# Patient Record
Sex: Female | Born: 2005 | Race: White | Hispanic: No | Marital: Single | Smoking: Never smoker
Health system: Southern US, Community
[De-identification: ages and names within clinical notes are randomized; demographics above are authoritative.]

## PROBLEM LIST (undated history)

## (undated) HISTORY — PX: TONSILLECTOMY: SUR1361

---
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
## 2023-10-19 ENCOUNTER — Ambulatory Visit
Admission: EM | Admit: 2023-10-19 | Discharge: 2023-10-19 | Disposition: A | Discharge status: Home / Self Care | Payer: BC Managed Care – PPO

## 2023-10-19 NOTE — ED Notes (Signed)
Pt left as we did not have x rays on site and she did not want to go to outpatient xrays.

## 2023-10-21 ENCOUNTER — Ambulatory Visit: Payer: Self-pay | Admitting: Physician Assistant

## 2023-10-21 NOTE — Telephone Encounter (Signed)
   Chief Complaint: numbness in toes on left foot Symptoms: toes cold and numb Frequency: constant  Disposition: [] ED /[] Urgent Care (no appt availability in office) / [x] Appointment(In office/virtual)/ []  [place] [facility]/ [] Home Care/ [] Refused Recommended Disposition /[] [place] [facility]/ []  Follow-up with PCP Additional Notes: Pt complaining since Friday that left foot toes are numb  and cold. Pt was at work and doesn't remember any sort of accident to cause this. Pt went to urgent care Sunday for xray- Pt was told no visible broken bones. Pt still has numbness and cold toes and wants to make appt with PCP. PT transferred to [facility] for appt to be made. RN gave care advice and pt verbalized understanding.         Copied from CRM [id number]. Topic: Clinical - Red Word Triage >> Oct 21, 2023  9:01 AM Fuller Mandril wrote: Red Word that prompted transfer to Nurse Triage: Numbness tingling left foot. Circulation concerns. Seen at urgent care with x-rays. Reason for Disposition  [1] Weakness of the face, arm / hand, or leg / foot on one side of the body AND [2] gradual onset (e.g., days to weeks) AND [3] present now  Answer Assessment - Initial Assessment Questions 1. SYMPTOM: "What is the main symptom you are concerned about?" (e.g., weakness, numbness)     Numbness in left foot toes  2. ONSET: "When did this start?" (minutes, hours, days; while sleeping)     friday 3. LAST NORMAL: "When was the last time you (the patient) were normal (no symptoms)?"     Thursday  4. PATTERN "Does this come and go, or has it been constant since it started?"  "Is it present now?"     Left foot Numbness comes and goes  5. CARDIAC SYMPTOMS: "Have you had any of the following symptoms: chest pain, difficulty breathing, palpitations?"     Denies  6. NEUROLOGIC SYMPTOMS: "Have you had any of the following symptoms: headache, dizziness, vision loss, double vision, changes in speech, unsteady on your feet?"      Denies all  7. OTHER SYMPTOMS: "Do you have any other symptoms?"     Denies  8. PREGNANCY: "Is there any chance you are pregnant?" "When was your last menstrual period?"     Possibility  Protocols used: Neurologic Deficit-A-AH

## 2023-10-23 ENCOUNTER — Ambulatory Visit: Payer: Managed Care, Other (non HMO) | Admitting: Sports Medicine

## 2023-10-23 DIAGNOSIS — J309 Allergic rhinitis, unspecified: Secondary | ICD-10-CM | POA: Insufficient documentation

## 2023-10-23 DIAGNOSIS — Z23 Encounter for immunization: Secondary | ICD-10-CM | POA: Insufficient documentation

## 2023-10-23 DIAGNOSIS — H6120 Impacted cerumen, unspecified ear: Secondary | ICD-10-CM | POA: Insufficient documentation

## 2023-10-23 DIAGNOSIS — R059 Cough, unspecified: Secondary | ICD-10-CM | POA: Insufficient documentation

## 2023-10-23 DIAGNOSIS — Z049 Encounter for examination and observation for unspecified reason: Secondary | ICD-10-CM | POA: Insufficient documentation

## 2023-10-23 DIAGNOSIS — J40 Bronchitis, not specified as acute or chronic: Secondary | ICD-10-CM | POA: Insufficient documentation

## 2023-10-23 DIAGNOSIS — L853 Xerosis cutis: Secondary | ICD-10-CM | POA: Insufficient documentation

## 2023-10-23 DIAGNOSIS — Z111 Encounter for screening for respiratory tuberculosis: Secondary | ICD-10-CM | POA: Insufficient documentation

## 2023-10-23 DIAGNOSIS — J310 Chronic rhinitis: Secondary | ICD-10-CM | POA: Insufficient documentation

## 2023-10-23 DIAGNOSIS — M25572 Pain in left ankle and joints of left foot: Secondary | ICD-10-CM | POA: Insufficient documentation

## 2023-10-23 DIAGNOSIS — H6640 Suppurative otitis media, unspecified, unspecified ear: Secondary | ICD-10-CM | POA: Insufficient documentation

## 2023-10-23 DIAGNOSIS — N739 Female pelvic inflammatory disease, unspecified: Secondary | ICD-10-CM | POA: Insufficient documentation

## 2023-10-23 DIAGNOSIS — M6740 Ganglion, unspecified site: Secondary | ICD-10-CM | POA: Insufficient documentation

## 2023-10-23 DIAGNOSIS — H9209 Otalgia, unspecified ear: Secondary | ICD-10-CM | POA: Insufficient documentation

## 2023-10-23 MED ORDER — CELECOXIB 200 MG PO CAPS: ORAL_CAPSULE | ORAL | 2 refills | Status: DC

## 2023-10-23 NOTE — Assessment & Plan Note (Signed)
 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 About a week ago she stood up and felt a pop left ankle lateral aspect. She had immediate pain, swelling, and inability to bear weight. Ultimately she was seen in [facility] urgent care, x-rays were done that were self-reported as negative though I do not have images. She was placed in a boot and referred to me. She is doing a lot better, swelling has resolved, paresthesias have gotten a lot better. She still has some tenderness that she localizes at the ATFL, lesser so over the peroneals, she also has some tenderness lateral talar dome. Otherwise good motion, good strength, sensation is grossly intact distally and she has good dorsalis pedis and posterior tibial pulses as well as normal capillary refill in the toes. Suspect ankle sprain, I think the abnormal sensation was likely related to her swelling. Continue boot for another 2 weeks, she will get the x-rays on a disk for me, adding some home physical therapy, she will do some Celebrex  with food. If insufficient improvement after 2 or 3 weeks we will consider MRI.

## 2023-10-23 NOTE — Progress Notes (Signed)
    Procedures performed today:    None.  Independent interpretation of notes and tests performed by another provider:   None.  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 About a week ago she stood up and felt a pop left ankle lateral aspect. She had immediate pain, swelling, and inability to bear weight. Ultimately she was seen in [facility] urgent care, x-rays were done that were self-reported as negative though I do not have images. She was placed in a boot and referred to me. She is doing a lot better, swelling has resolved, paresthesias have gotten a lot better. She still has some tenderness that she localizes at the ATFL, lesser so over the peroneals, she also has some tenderness lateral talar dome. Otherwise good motion, good strength, sensation is grossly intact distally and she has good dorsalis pedis and posterior tibial pulses as well as normal capillary refill in the toes. Suspect ankle sprain, I think the abnormal sensation was likely related to her swelling. Continue boot for another 2 weeks, she will get the x-rays on a disk for me, adding some home physical therapy, she will do some Celebrex  with food. If insufficient improvement after 2 or 3 weeks we will consider MRI.    ____________________________________________ Debby PARAS. Curtis, M.D., ABFM., CAQSM., AME. Primary Care and Sports Medicine [facility]  Adjunct Professor of [facility] Medicine  [employer]  Restaurant Manager, [employer]

## 2023-10-23 NOTE — Addendum Note (Signed)
 Addended by: Arvel Lather A on: 10/23/2023 09:29 AM   Modules accepted: Orders

## 2023-10-24 NOTE — Telephone Encounter (Signed)
 Patient seen in office 10/23/23 by Dr. Sandy Crumb.

## 2023-11-09 IMAGING — CT CT ABD-PELV W/ CM
2 of 4 series · 16 of 46 positions shown, 18 images · IV contrast (agent unspecified)
Comparison: None Available.

CLINICAL DATA: Acute epigastric abdominal pain.

EXAM:
CT ABDOMEN AND PELVIS WITH CONTRAST
TECHNIQUE: Multidetector CT imaging of the abdomen and pelvis was performed
using the standard protocol following bolus administration of
intravenous contrast.

[Series 3: a/p w/ 5mm · axial · 0.80mm/px · z∈[+842,+1262]mm · 13 of 92 slices shown, 15 images]
[im 4/92  soft-tissue]
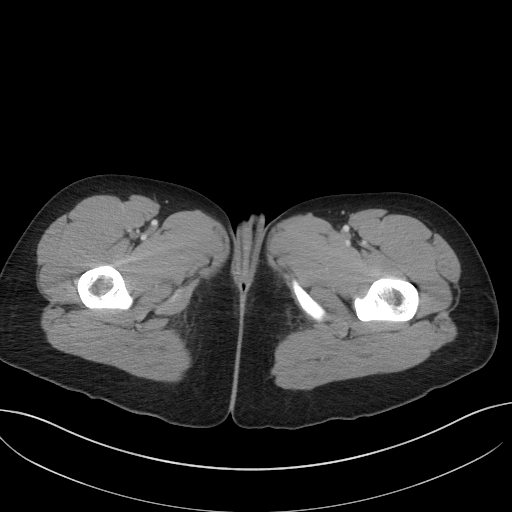
[im 4/92  bone]
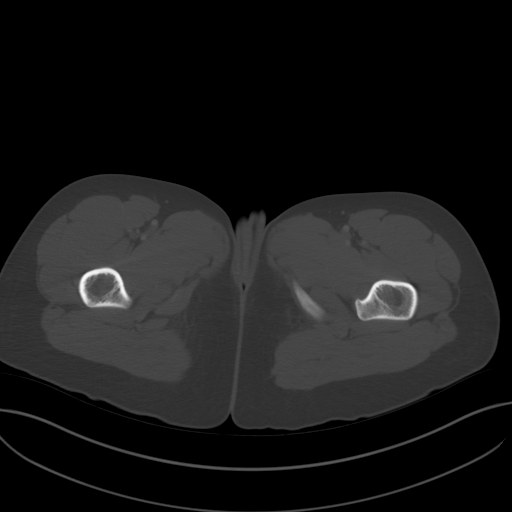
[im 11/92  soft-tissue]
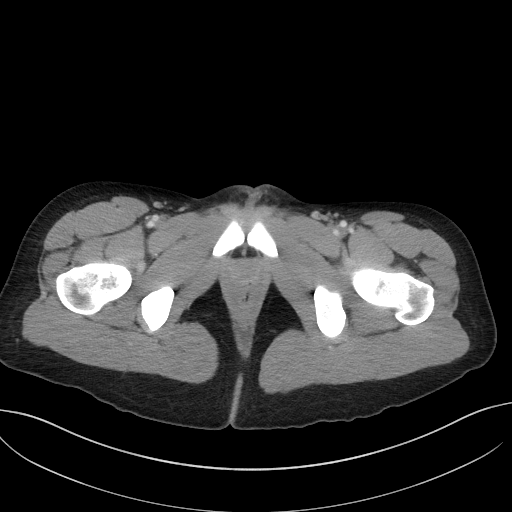
[im 18/92  soft-tissue]
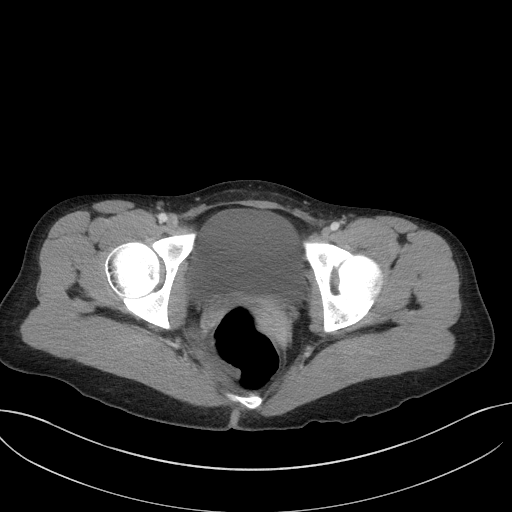
[im 25/92  soft-tissue]
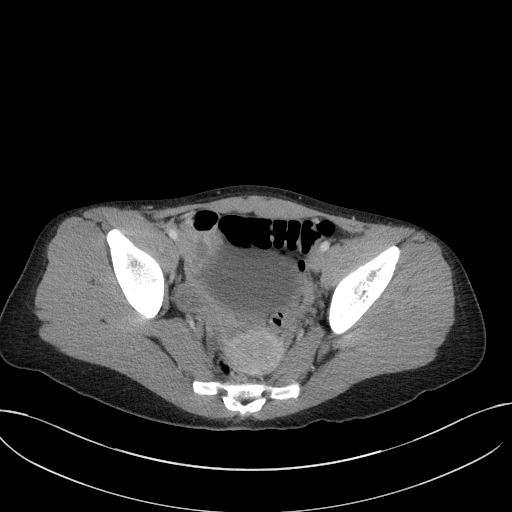
[im 32/92  soft-tissue]
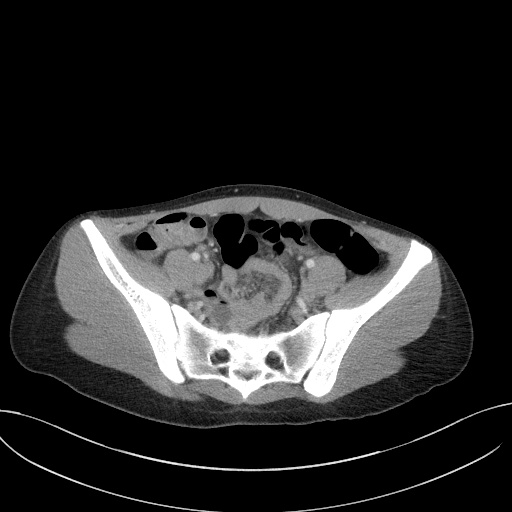
[im 39/92  soft-tissue]
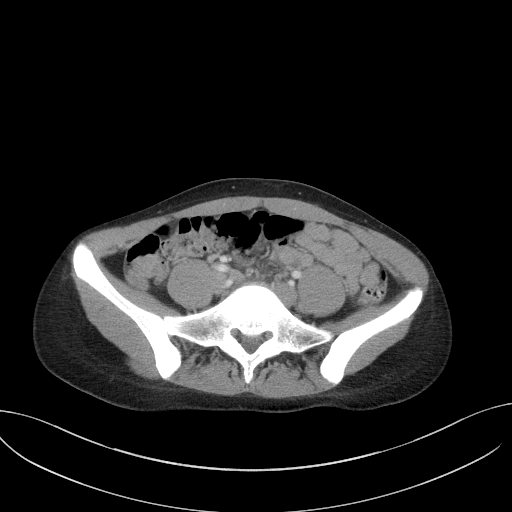
[im 46/92  soft-tissue]
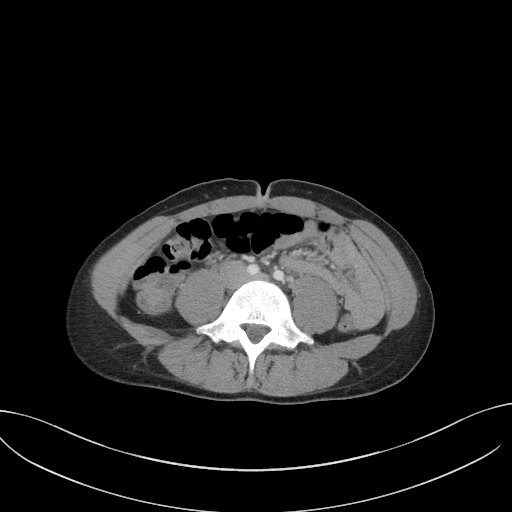
[im 53/92  soft-tissue]
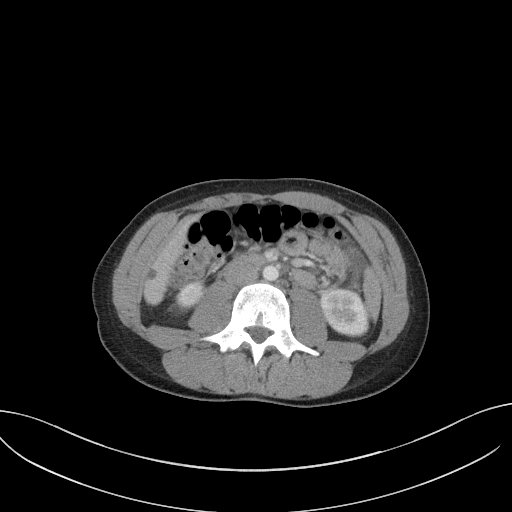
[im 60/92  soft-tissue]
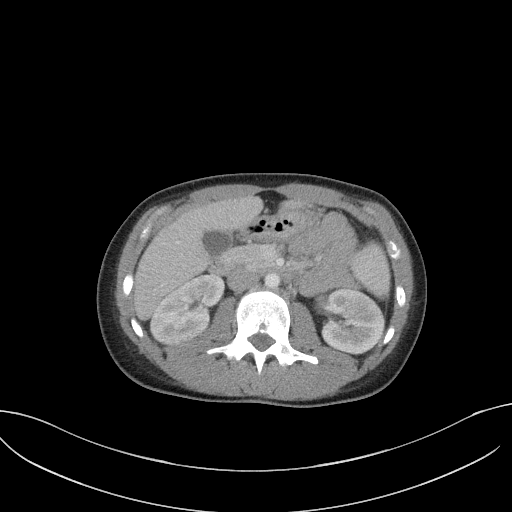
[im 60/92  bone]
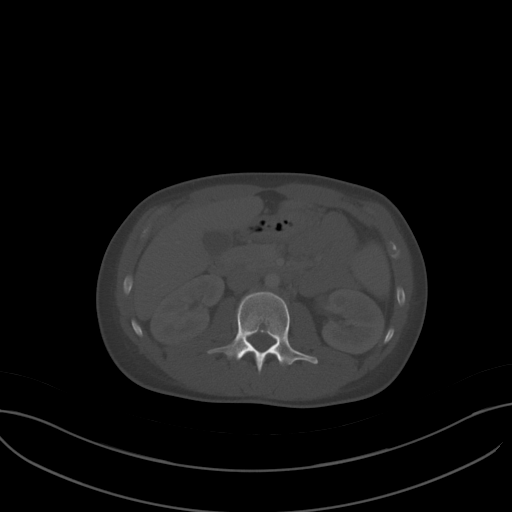
[im 67/92  soft-tissue]
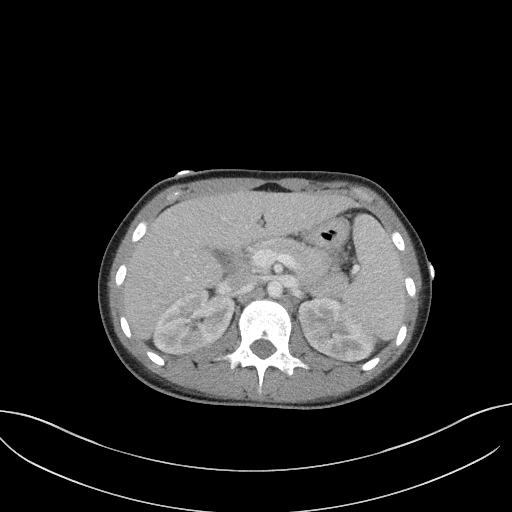
[im 74/92  soft-tissue]
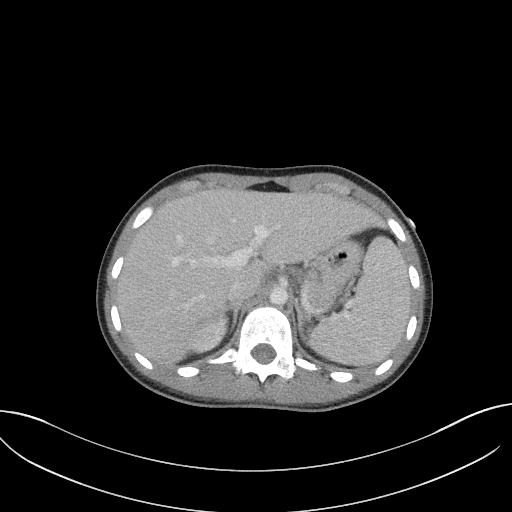
[im 81/92  soft-tissue]
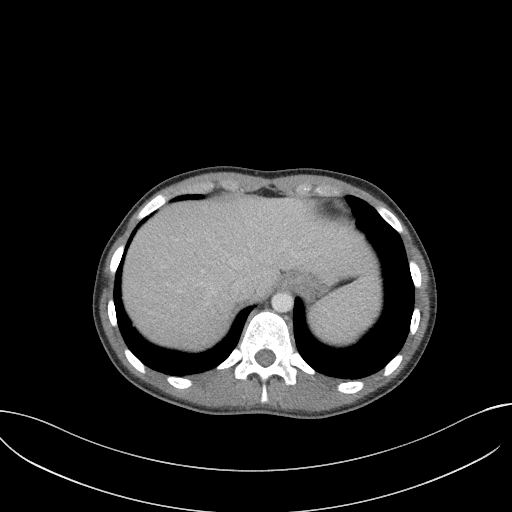
[im 88/92  soft-tissue]
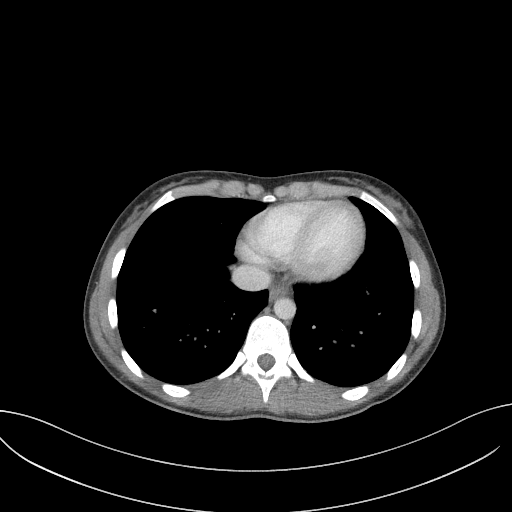

[Series 6: a/p w/ cor · coronal · 0.75mm/px · 3 of 115 slices shown]
[im 39/115  soft-tissue]
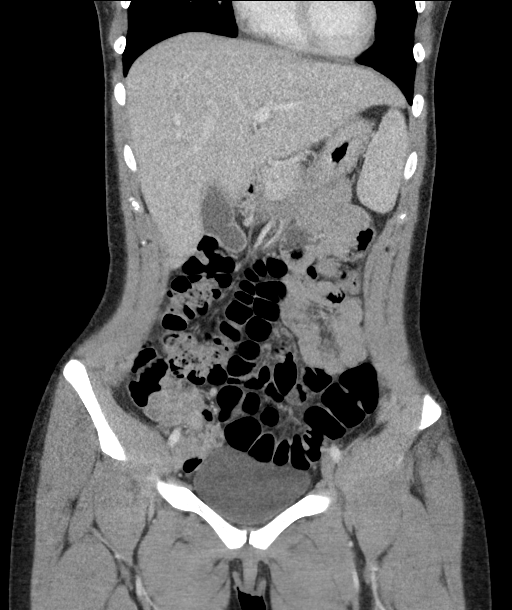
[im 51/115  soft-tissue]
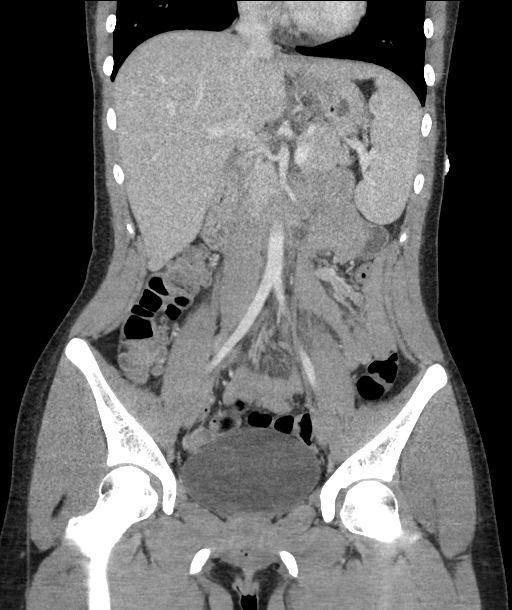
[im 64/115  soft-tissue]
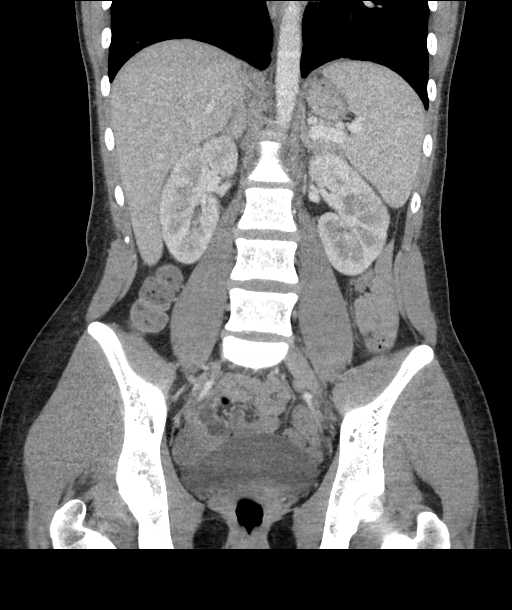

[16 of 46 positions shown; findings below may reference images not displayed]

RADIATION DOSE REDUCTION: This exam was performed according to the
departmental dose-optimization program which includes automated
exposure control, adjustment of the mA and/or kV according to
patient size and/or use of iterative reconstruction technique.

CONTRAST:  100mL OMNIPAQUE IOHEXOL 300 MG/ML  SOLN
FINDINGS: Lower chest: No acute abnormality.

Hepatobiliary: No focal liver abnormality is seen. No gallstones,
gallbladder wall thickening, or biliary dilatation.

Pancreas: Unremarkable. No pancreatic ductal dilatation or
surrounding inflammatory changes.

Spleen: Normal in size without focal abnormality.

Adrenals/Urinary Tract: Adrenal glands are unremarkable. Kidneys are
normal, without renal calculi, focal lesion, or hydronephrosis.
Bladder is unremarkable.

Stomach/Bowel: Stomach is within normal limits. Appendix appears
normal. No evidence of bowel wall thickening, distention, or
inflammatory changes.

Vascular/Lymphatic: No significant vascular findings are present. No
enlarged abdominal or pelvic lymph nodes.

Reproductive: Uterus and bilateral adnexa are unremarkable.

Other: No abdominal wall hernia or abnormality. No abdominopelvic
ascites.

Musculoskeletal: No acute or significant osseous findings.
IMPRESSION: No definite abnormality seen in the abdomen or pelvis.

## 2023-11-11 ENCOUNTER — Telehealth: Payer: Self-pay | Admitting: Sports Medicine

## 2023-11-11 NOTE — Telephone Encounter (Signed)
 Copied from CRM [id number]. Topic: Medical Record Request - Records Request >> Nov 08, 2023  2:12 PM Eunice Blase wrote: Reason for CRM: Received call from [facility]: Lambert Rhonda W per Parker ph: [phone], fax: [phone] sent request for treatment with Dr. Lorenda Peck was sent 10/25/2023. Please respond to request.

## 2023-11-13 ENCOUNTER — Encounter: Payer: Self-pay | Admitting: Sports Medicine

## 2023-11-13 ENCOUNTER — Ambulatory Visit (INDEPENDENT_AMBULATORY_CARE_PROVIDER_SITE_OTHER): Payer: BC Managed Care – PPO | Admitting: Sports Medicine

## 2023-11-13 DIAGNOSIS — G8929 Other chronic pain: Secondary | ICD-10-CM | POA: Diagnosis not present

## 2023-11-13 DIAGNOSIS — M25572 Pain in left ankle and joints of left foot: Secondary | ICD-10-CM

## 2023-11-13 NOTE — Assessment & Plan Note (Signed)
 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 Also thinking about ultrasound school. Approximately 4 to 5 weeks ago she stood up and felt a pop left lateral ankle, she had immediate pain, swelling, and inability to bear weight, [facility] that were [facility]. She was placed in a boot. Swelling had resolved by her follow-up and paresthesias had improved considerably, she did have some tenderness at the ATFL and less so at the peroneals, also tenderness lateral talar dome. She has now been in the boot for about 3 weeks, unfortunately she is still complaining of pain mostly over the dorsal midfoot. No tenderness over the ATFL, good strength, however she does have some pain with terminal dorsiflexion. She also has tenderness over the third and fourth metatarsal shafts and TMT. Due to persistence of discomfort with concern for occult fracture we will proceed with an MRI of the foot and ankle. Worker's Comp. is covering all of her bills. She will continue Tylenol as needed and continue the boot for now. Return to see me to go over MRI results.

## 2023-11-13 NOTE — Progress Notes (Signed)
    Procedures performed today:    None.  Independent interpretation of notes and tests performed by another provider:   None.  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 Approximately 4 to 5 weeks ago she stood up and felt a pop left lateral ankle, she had immediate pain, swelling, and inability to bear weight, x-rays were done at a [facility] that were negative. She was placed in a boot. Swelling had resolved by her follow-up and paresthesias had improved considerably, she did have some tenderness at the ATFL and less so at the peroneals, also tenderness lateral talar dome. She has now been in the boot for about 3 weeks, unfortunately she is still complaining of pain mostly over the dorsal midfoot. No tenderness over the ATFL, good strength, however she does have some pain with terminal dorsiflexion. She also has tenderness over the third and fourth metatarsal shafts and TMT. Due to persistence of discomfort with concern for occult fracture we will proceed with an MRI of the foot and ankle. Worker's Comp. is covering all of her bills. She will continue Tylenol as needed and continue the boot for now. Return to see me to go over MRI results.    ____________________________________________ Ihor Austin. Benjamin Stain, M.D., ABFM., CAQSM., AME. Primary Care and Sports Medicine [facility]  Adjunct Professor of Family Medicine  [employer]  Restaurant manager, fast food

## 2023-11-15 ENCOUNTER — Telehealth: Payer: Self-pay

## 2023-11-15 NOTE — Telephone Encounter (Signed)
 Looks like patient has been scheduled.

## 2023-11-15 NOTE — Telephone Encounter (Signed)
 Copied from CRM [id number]. Topic: Referral - Status >> Nov 14, 2023  2:20 PM Priscille Loveless wrote: Reason for CRM: Pt is calling to check on the status of the mri that she discussed with the dr yesterday. Please advise, thank you!

## 2023-11-16 ENCOUNTER — Ambulatory Visit (INDEPENDENT_AMBULATORY_CARE_PROVIDER_SITE_OTHER): Payer: Worker's Compensation

## 2023-11-16 DIAGNOSIS — G8929 Other chronic pain: Secondary | ICD-10-CM

## 2023-11-16 DIAGNOSIS — M25572 Pain in left ankle and joints of left foot: Secondary | ICD-10-CM | POA: Diagnosis not present

## 2023-12-10 NOTE — Telephone Encounter (Signed)
 Yes that is fine, can try to do it as an exchange with Rayfield Citizen from [place].  That way she does not have to pay for a new one.

## 2023-12-10 NOTE — Telephone Encounter (Signed)
 Please review the previous note. Can she come in for a new boot? Thanks in advance.

## 2023-12-10 NOTE — Telephone Encounter (Signed)
 Copied from CRM [id number]. Topic: Clinical - Lab/Test Results >> Dec 10, 2023  1:08 PM Yolanda T wrote: Reason for CRM: patient would like a call back to better explain her test results, patient broke her boot and would like to know if provider can write a script to replace her left foot boot. Please f/u with patient

## 2023-12-11 ENCOUNTER — Telehealth: Payer: Self-pay

## 2023-12-11 DIAGNOSIS — M25572 Pain in left ankle and joints of left foot: Secondary | ICD-10-CM

## 2023-12-11 MED ORDER — MELOXICAM 15 MG PO TABS: ORAL_TABLET | ORAL | 3 refills | Status: AC

## 2023-12-11 NOTE — Telephone Encounter (Unsigned)
 Copied from CRM [id number]. Topic: General - Other >> Dec 11, 2023  2:03 PM Gildardo Pounds wrote: Reason for CRM: Patient is returning a call from [place]. Callback number is [phone]

## 2023-12-11 NOTE — Telephone Encounter (Signed)
 Task completed. Patient was provided with a new boot today during a [place].

## 2023-12-11 NOTE — Telephone Encounter (Signed)
 Spoke with patient she had questioned whether could get a second opinion referral from our office for worker's comp In speaking with patient questioned if should  get referral for second opinion through workers comp ?  She was not sure who should request the 2nd opinion but she will speak with her Marketing executive and get back to [place] to let us know how she would like to proceed.

## 2023-12-11 NOTE — Telephone Encounter (Signed)
 Patient requesting a prescription for pain medication - due to reactive edema/stress injury medial malleolus of the ankle  States current pain level is about 7 .  States she is on her feet a lot at work ( will continue to use the boot as well)

## 2023-12-11 NOTE — Telephone Encounter (Signed)
 Spoke with patient informed about meloxicam prescription

## 2023-12-11 NOTE — Telephone Encounter (Signed)
Attempted call to patient . Mail box full could not leave a voice mail message.  

## 2023-12-11 NOTE — Telephone Encounter (Signed)
 Adding meloxicam, have her let me know over the next week if this is not enough.

## 2023-12-23 ENCOUNTER — Encounter: Payer: Self-pay | Admitting: Sports Medicine

## 2024-05-19 ENCOUNTER — Encounter: Payer: Self-pay | Admitting: Sports Medicine

## 2024-06-08 ENCOUNTER — Ambulatory Visit: Admission: EM | Admit: 2024-06-08 | Discharge: 2024-06-08 | Disposition: A | Discharge status: Home / Self Care

## 2024-06-08 DIAGNOSIS — J029 Acute pharyngitis, unspecified: Secondary | ICD-10-CM | POA: Insufficient documentation

## 2024-06-08 DIAGNOSIS — R0981 Nasal congestion: Secondary | ICD-10-CM | POA: Insufficient documentation

## 2024-06-08 DIAGNOSIS — B372 Candidiasis of skin and nail: Secondary | ICD-10-CM | POA: Insufficient documentation

## 2024-06-08 DIAGNOSIS — Z1152 Encounter for screening for COVID-19: Secondary | ICD-10-CM | POA: Diagnosis not present

## 2024-06-08 DIAGNOSIS — Z020 Encounter for examination for admission to educational institution: Secondary | ICD-10-CM | POA: Insufficient documentation

## 2024-06-08 DIAGNOSIS — B9789 Other viral agents as the cause of diseases classified elsewhere: Secondary | ICD-10-CM | POA: Insufficient documentation

## 2024-06-08 DIAGNOSIS — N949 Unspecified condition associated with female genital organs and menstrual cycle: Secondary | ICD-10-CM | POA: Insufficient documentation

## 2024-06-08 DIAGNOSIS — J069 Acute upper respiratory infection, unspecified: Secondary | ICD-10-CM | POA: Insufficient documentation

## 2024-06-08 DIAGNOSIS — Z029 Encounter for administrative examinations, unspecified: Secondary | ICD-10-CM | POA: Insufficient documentation

## 2024-06-08 DIAGNOSIS — Z23 Encounter for immunization: Secondary | ICD-10-CM | POA: Insufficient documentation

## 2024-06-08 DIAGNOSIS — H6642 Suppurative otitis media, unspecified, left ear: Secondary | ICD-10-CM | POA: Insufficient documentation

## 2024-06-08 DIAGNOSIS — J310 Chronic rhinitis: Secondary | ICD-10-CM | POA: Insufficient documentation

## 2024-06-08 DIAGNOSIS — H6690 Otitis media, unspecified, unspecified ear: Secondary | ICD-10-CM | POA: Insufficient documentation

## 2024-06-08 DIAGNOSIS — M67439 Ganglion, unspecified wrist: Secondary | ICD-10-CM | POA: Insufficient documentation

## 2024-06-08 LAB — POCT RAPID STREP A (OFFICE): Rapid Strep A Screen: NEGATIVE

## 2024-06-08 LAB — POC SOFIA SARS ANTIGEN FIA: SARS Coronavirus 2 Ag: NEGATIVE

## 2024-06-08 MED ORDER — PSEUDOEPH-BROMPHEN-DM 30-2-10 MG/5ML PO SYRP: 10.0000 mL | ORAL_SOLUTION | Freq: Four times a day (QID) | ORAL | 0 refills | Status: AC | PRN

## 2024-06-08 MED ORDER — FLUTICASONE PROPIONATE 50 MCG/ACT NA SUSP: 2.0000 | Freq: Every day | NASAL | 0 refills | Status: AC

## 2024-06-08 MED ORDER — CETIRIZINE-PSEUDOEPHEDRINE ER 5-120 MG PO TB12: 1.0000 | ORAL_TABLET | Freq: Every day | ORAL | 0 refills | Status: AC

## 2024-06-08 MED ORDER — PREDNISONE 20 MG PO TABS: 40.0000 mg | ORAL_TABLET | Freq: Every day | ORAL | 0 refills | Status: AC

## 2024-06-08 NOTE — Discharge Instructions (Addendum)
 Your strep and COVID tests were both negative. Your symptoms are most likely caused by a respiratory infection, which affects areas like your nose, throat, or lungs. This type of infection is usually caused by a virus. Since your illness is caused by a virus, antibiotics won't help because they only treat infections caused by bacteria.  Take the medications that were prescribed to you as directed. If you have a fever, headache, or body aches, you can also take Tylenol  or ibuprofen  to help you feel more comfortable. Be sure to drink plenty of fluids to stay hydrated--aim for enough to keep your urine a pale yellow color. This will also help to thin mucus and make it easier to clear from your body.   Using a cool mist humidifier at home to keep humidity levels above 50% can be helpful. You can also inhale steam for 10 to 15 minutes, 3 to 4 times a day. This can be done by sitting in the bathroom with a hot shower running, or by using over-the-counter vapor shower tablets to help with nasal congestion. Try to avoid cool or dry air as much as possible. When you sleep, keep your head elevated to help reduce post-nasal drainage. Be sure to get enough rest every night to support your recovery.Don't forget to replace your toothbrush once you start feeling better.   It's normal for a cough to linger for several weeks after a respiratory illness, even after other symptoms have resolved. This happens because the airways remain irritated and take time to fully heal. As long as the cough gradually improves and there are no new concerning symptoms, this is part of the normal recovery process.  If your symptoms get worse or if you develop any new or concerning symptoms, go to the emergency room right away. If you're not feeling better in a few days, follow up with your primary care provider.

## 2024-06-08 NOTE — ED Provider Notes (Signed)
 [facility]    CSN: [id number] Arrival date & time: 06/08/24  0803      History   Chief Complaint Chief Complaint  Patient presents with   Sore Throat   Nasal Congestion   Fatigue    HPI Rachel Hodges is a 18 y.o. female.   Discussed the use of AI scribe software for clinical note transcription with the patient, who gave verbal consent to proceed.   The patient presents with complaints of chest tightness, overall weakness, body aches, and occasional diarrhea. The onset of symptoms was about 3-4 days. The patient describes intermittent chest tightness that comes and goes. When it occurs, the tightness ascends during breathing, becomes painful for a moment, and then subsides. The patient denies constant chest pain and reports that it's fine right now. She also mentions experiencing some pain with deep breaths.  The patient reports coughing up phlegm but denies significant nasal congestion, runny nose, or sneezing. She also deny shortness of breath or wheezing. The patient is unsure about the presence of fever. The patient has been taking over-the-counter Tylenol  for symptom relief. They report being around someone with strep throat last week.   The following sections of the patient's history were reviewed and updated as appropriate: allergies, current medications, past family history, past medical history, past social history, past surgical history, and problem list.      History reviewed. No pertinent past medical history.  Patient Active Problem List   Diagnosis Date Noted   Otitis media 06/08/2024   Nasal congestion 06/08/2024   Diaper candidiasis 06/08/2024   Suppurative otitis media of left ear 06/08/2024   Purulent rhinitis 06/08/2024   Encounters for administrative purposes 06/08/2024   Encounter for childhood immunizations appropriate for age 59/22/2025   Encounter for examination for admission to educational institution 06/08/2024   Gynecological  disease 06/08/2024   Need for hepatitis A immunization 06/08/2024   Ganglion of wrist 06/08/2024   Need for prophylactic vaccination against polio 06/08/2024   Allergic rhinitis 10/23/2023   Chronic rhinitis 10/23/2023   Bronchitis 10/23/2023   Cerumen impaction 10/23/2023   Cough 10/23/2023   Ganglion of tendon sheath 10/23/2023   Inflammatory disease of cervix, vagina, 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   Need for prophylactic vaccination with combined diphtheria-tetanus-pertussis (DTP) vaccine 10/23/2023   Neonatal Candida infection 10/23/2023   Observation for suspected condition 10/23/2023   Otalgia 10/23/2023   Screening examination for pulmonary tuberculosis 10/23/2023   Suppurative otitis media 10/23/2023   Xerosis cutis 10/23/2023   Left ankle pain 10/23/2023   Acquired flat foot 07/20/2019   BMI (body mass index), pediatric, 5% to less than 85% for age 36/10/2018   Hammer toe 07/20/2019   Sprain of wrist 07/20/2019   History of seasonal allergies 07/20/2019   Encounter to establish care with new doctor 07/20/2019   Sprain of ankle, right 12/09/2017   Sprain of wrist, left 06/21/2017   Closed fracture of left distal radius 01/16/2016    Past Surgical History:  Procedure Laterality Date   TONSILLECTOMY      OB History   No obstetric history on file.      Home Medications    Prior to Admission medications   Medication Sig Start Date End Date Taking? Authorizing Provider  acetaminophen  (TYLENOL ) 325 MG tablet Take 650 mg by mouth every 6 (six) hours as needed.   Yes [provider]  brompheniramine-pseudoephedrine -DM 30-2-10 MG/5ML syrup Take 10 mLs by mouth every 6 (six) hours as needed (cough  and congestion). 06/08/24  Yes Coalton Arch, FNP  cetirizine -pseudoephedrine  (ZYRTEC -D) 5-120 MG tablet Take 1 tablet by mouth daily with breakfast for 10 days. 06/08/24 06/18/24 Yes Iola Lukes, FNP  fluticasone  (FLONASE ) 50 MCG/ACT nasal spray Place 2 sprays into both  nostrils daily. Shake well before use. Gently blow nose before spraying. Do not blow nose immediately after use. You should not taste the medication or feel it going down your throat; if you do, adjust your technique. 06/08/24  Yes Iola Lukes, FNP  ondansetron  (ZOFRAN -ODT) 4 MG disintegrating tablet Take 4 mg by mouth every 8 (eight) hours as needed. 11/17/23  Yes [provider]  predniSONE  (DELTASONE ) 20 MG tablet Take 2 tablets (40 mg total) by mouth daily for 5 days. 06/08/24 06/13/24 Yes Iola Lukes, FNP  drospirenone-ethinyl estradiol (YAZ) 3-0.02 MG tablet Take 1 tablet by mouth daily. 01/08/22   [provider]  meloxicam  (MOBIC ) 15 MG tablet One tab PO every 24 hours with a meal for 2 weeks, then once every 24 hours prn pain. 12/11/23   Curtis Debby PARAS, MD  ondansetron  (ZOFRAN -ODT) 4 MG disintegrating tablet Take 1 tablet (4 mg total) by mouth every 8 (eight) hours as needed for nausea or vomiting. 02/25/22   Crain, Whitney L, PA  triamcinolone  (KENALOG ) 0.1 % paste Use as directed 1 application  in the mouth or throat 2 (two) times daily. 02/25/22   Lowella Benton CROME, PA    Family History Family History  Problem Relation Age of Onset   Thyroid disease Mother     Social History Social History   Tobacco Use   Smoking status: Never   Smokeless tobacco: Never  Vaping Use   Vaping status: Never Used  Substance Use Topics   Alcohol use: Never   Drug use: No     Allergies   Motrin  [ibuprofen ], Cephalosporins, Doxycycline, Cefdinir , and Sulfa antibiotics   Review of Systems Review of Systems  Constitutional:  Positive for fatigue. Negative for fever.  HENT:  Positive for sore throat. Negative for congestion, rhinorrhea and sneezing.   Respiratory:  Positive for cough. Negative for shortness of breath and wheezing.   Cardiovascular:  Positive for chest pain (intermittent; occurs when deep breathing; tightness with cough).  Gastrointestinal:   Positive for diarrhea and nausea. Negative for vomiting.  Musculoskeletal:  Positive for myalgias.  Neurological:  Positive for headaches.  All other systems reviewed and are negative.    Physical Exam Triage Vital Signs ED Triage Vitals  Encounter Vitals Group     BP 06/08/24 0827 103/69     Girls Systolic BP Percentile --      Girls Diastolic BP Percentile --      Boys Systolic BP Percentile --      Boys Diastolic BP Percentile --      Pulse Rate 06/08/24 0827 88     Resp 06/08/24 0827 18     Temp 06/08/24 0827 97.8 F (36.6 C)     Temp Source 06/08/24 0827 Oral     SpO2 06/08/24 0827 96 %     Weight 06/08/24 0823 135 lb (61.2 kg)     Height 06/08/24 0823 5' 8 (1.727 m)     Head Circumference --      Peak Flow --      Pain Score 06/08/24 0820 4     Pain Loc --      Pain Education --      Exclude from Growth Chart --  No data found.  Updated Vital Signs BP 103/69 (BP Location: Left Arm)   Pulse 88   Temp 97.8 F (36.6 C) (Oral)   Resp 18   Ht 5' 8 (1.727 m)   Wt 135 lb (61.2 kg)   LMP 05/29/2024 (Exact Date)   SpO2 96%   BMI 20.53 kg/m   Visual Acuity Right Eye Distance:   Left Eye Distance:   Bilateral Distance:    Right Eye Near:   Left Eye Near:    Bilateral Near:     Physical Exam Vitals reviewed.  Constitutional:      General: She is awake. She is not in acute distress.    Appearance: Normal appearance. She is well-developed. She is not ill-appearing, toxic-appearing or diaphoretic.  HENT:     Head: Normocephalic.     Right Ear: Tympanic membrane, ear canal and external ear normal. No drainage, swelling or tenderness. No middle ear effusion. Tympanic membrane is not erythematous.     Left Ear: Tympanic membrane, ear canal and external ear normal. No drainage, swelling or tenderness.  No middle ear effusion. Tympanic membrane is not erythematous.     Nose: No congestion or rhinorrhea.     Mouth/Throat:     Lips: Pink.     Mouth: Mucous  membranes are moist.     Pharynx: Oropharynx is clear. Uvula midline. No pharyngeal swelling, oropharyngeal exudate, posterior oropharyngeal erythema or uvula swelling.     Tonsils: No tonsillar exudate or tonsillar abscesses.  Eyes:     General: Vision grossly intact.     Conjunctiva/sclera: Conjunctivae normal.  Cardiovascular:     Rate and Rhythm: Normal rate.     Heart sounds: Normal heart sounds.  Pulmonary:     Effort: Pulmonary effort is normal. No tachypnea or respiratory distress.     Breath sounds: Normal breath sounds and air entry.  Musculoskeletal:        General: Normal range of motion.     Cervical back: Full passive range of motion without pain, normal range of motion and neck supple.  Lymphadenopathy:     Cervical: No cervical adenopathy.  Skin:    General: Skin is warm and dry.  Neurological:     General: No focal deficit present.     Mental Status: She is alert and oriented to person, place, and time.  Psychiatric:        Behavior: Behavior is cooperative.      UC Treatments / Results  Labs (all labs ordered are listed, but only abnormal results are displayed) Labs Reviewed  POC SOFIA SARS ANTIGEN FIA - Normal  POCT RAPID STREP A (OFFICE) - Normal    EKG   Radiology No results found.  Procedures Procedures (including critical care time)  Medications Ordered in UC Medications - No data to display  Initial Impression / Assessment and Plan / UC Course  I have reviewed the triage vital signs and the nursing notes.  Pertinent labs & imaging results that were available during my care of the patient were reviewed by me and considered in my medical decision making (see chart for details).     The patient presents with symptoms consistent with a viral upper respiratory infection. COVID and strep negative. Exam is reassuring and no evidence of bacterial infection or acute cardiopulmonary process is noted. Supportive care is recommended. Patient was  advised to follow up with primary care if symptoms do not improve within one week or if new concerns arise.  Instructions were given to seek emergency care if symptoms worsen, including shortness of breath, chest pain, persistent high fever, inability to tolerate fluids, or confusion.  Today's evaluation has revealed no signs of a dangerous process. Discussed diagnosis with patient and/or guardian. Patient and/or guardian aware of their diagnosis, possible red flag symptoms to watch out for and need for close follow up. Patient and/or guardian understands verbal and written discharge instructions. Patient and/or guardian comfortable with plan and disposition.  Patient and/or guardian has a clear mental status at this time, good insight into illness (after discussion and teaching) and has clear judgment to make decisions regarding their care  Documentation was completed with the aid of voice recognition software. Transcription may contain typographical errors.   Final Clinical Impressions(s) / UC Diagnoses   Final diagnoses:  Acute sore throat  Viral upper respiratory tract infection     Discharge Instructions      Your strep and COVID tests were both negative. Your symptoms are most likely caused by a respiratory infection, which affects areas like your nose, throat, or lungs. This type of infection is usually caused by a virus. Since your illness is caused by a virus, antibiotics won't help because they only treat infections caused by bacteria.  Take the medications that were prescribed to you as directed. If you have a fever, headache, or body aches, you can also take Tylenol  or ibuprofen  to help you feel more comfortable. Be sure to drink plenty of fluids to stay hydrated--aim for enough to keep your urine a pale yellow color. This will also help to thin mucus and make it easier to clear from your body.   Using a cool mist humidifier at home to keep humidity levels above 50% can be helpful.  You can also inhale steam for 10 to 15 minutes, 3 to 4 times a day. This can be done by sitting in the bathroom with a hot shower running, or by using over-the-counter vapor shower tablets to help with nasal congestion. Try to avoid cool or dry air as much as possible. When you sleep, keep your head elevated to help reduce post-nasal drainage. Be sure to get enough rest every night to support your recovery.Don't forget to replace your toothbrush once you start feeling better.   It's normal for a cough to linger for several weeks after a respiratory illness, even after other symptoms have resolved. This happens because the airways remain irritated and take time to fully heal. As long as the cough gradually improves and there are no new concerning symptoms, this is part of the normal recovery process.  If your symptoms get worse or if you develop any new or concerning symptoms, go to the emergency room right away. If you're not feeling better in a few days, follow up with your primary care provider.            ED Prescriptions     Medication Sig Dispense Auth. Provider   brompheniramine-pseudoephedrine -DM 30-2-10 MG/5ML syrup Take 10 mLs by mouth every 6 (six) hours as needed (cough and congestion). 120 mL Iola Lukes, FNP   cetirizine -pseudoephedrine  (ZYRTEC -D) 5-120 MG tablet Take 1 tablet by mouth daily with breakfast for 10 days. 10 tablet Iola Lukes, FNP   fluticasone  (FLONASE ) 50 MCG/ACT nasal spray Place 2 sprays into both nostrils daily. Shake well before use. Gently blow nose before spraying. Do not blow nose immediately after use. You should not taste the medication or feel it going down your throat; if  you do, adjust your technique. 16 g Iola Lukes, FNP   predniSONE  (DELTASONE ) 20 MG tablet Take 2 tablets (40 mg total) by mouth daily for 5 days. 10 tablet Iola Lukes, FNP      PDMP not reviewed this encounter.   Iola Holden, [place] 06/08/24 [phone]

## 2024-06-08 NOTE — ED Triage Notes (Signed)
 Patient reports this starting Thursday with ha (constant with Tylenol  use not helping). The last time I had COVID19 I had a weird pain in my chest and I am having the same now. Also, stomach pains (that come and go). Congestion (not runny nose). No fever known. No seasonal Flu or COVID19 vaccine yet. No testing @ home.

## 2024-06-10 ENCOUNTER — Ambulatory Visit: Payer: Self-pay | Admitting: Nurse Practitioner

## 2024-06-10 LAB — CULTURE, GROUP A STREP (THRC)

## 2024-06-15 ENCOUNTER — Encounter (INDEPENDENT_AMBULATORY_CARE_PROVIDER_SITE_OTHER): Payer: Self-pay

## 2024-07-13 ENCOUNTER — Ambulatory Visit (INDEPENDENT_AMBULATORY_CARE_PROVIDER_SITE_OTHER): Admitting: Otolaryngology

## 2024-07-13 ENCOUNTER — Encounter (INDEPENDENT_AMBULATORY_CARE_PROVIDER_SITE_OTHER): Payer: Self-pay | Admitting: Otolaryngology

## 2024-07-13 VITALS — BP 119/84 | Temp 80.0°F | Ht 67.0 in | Wt 125.0 lb

## 2024-07-13 DIAGNOSIS — J343 Hypertrophy of nasal turbinates: Secondary | ICD-10-CM | POA: Insufficient documentation

## 2024-07-13 DIAGNOSIS — J310 Chronic rhinitis: Secondary | ICD-10-CM

## 2024-07-13 DIAGNOSIS — J3489 Other specified disorders of nose and nasal sinuses: Secondary | ICD-10-CM | POA: Diagnosis not present

## 2024-07-13 DIAGNOSIS — J342 Deviated nasal septum: Secondary | ICD-10-CM | POA: Insufficient documentation

## 2024-07-13 MED ORDER — FLONASE SENSIMIST 27.5 MCG/SPRAY NA SUSP: 2.0000 | Freq: Every day | NASAL | 12 refills | Status: AC

## 2024-07-13 NOTE — Progress Notes (Signed)
 CC: Chronic nasal obstruction  Discussed the use of AI scribe software for clinical note transcription with the patient, who gave verbal consent to proceed.  History of Present Illness Rachel Hodges is a 18 year old female who presents with chronic nasal obstruction and breathing difficulties.  She has experienced difficulty breathing through her nose for as long as she can remember, necessitating mouth breathing. This has led to poor sleep quality, as she frequently wakes up with a dry mouth.  Over the past couple of years, she has developed a significant mucus problem, characterized by difficulty blowing her nose and frequent expectoration of mucus throughout the day. She is unable to breathe through her nose during the day as well.  She has a history of playing soccer and recalls taking several hits to the face, but denies any significant trauma requiring hospital visits. She has a history of tonsillectomy and ear tube placement.  She experiences seasonal allergies, primarily in spring and fall, with symptoms of headache and congestion, but no sneezing, itchy eyes, or itchy nose. She recently had strep throat but denies recent sinus infections.  She works full-time as a print production planner and is not exposed to significant dust or allergens at work. She does not currently use any nasal sprays or allergy medications, as she dislikes using nasal products.   Past medical history: Environmental allergies, chronic nasal obstruction.  Past Surgical History:  Procedure Laterality Date   TONSILLECTOMY      Family History  Problem Relation Age of Onset   Thyroid disease Mother     Social History:  reports that she has never smoked. She has never used smokeless tobacco. She reports that she does not drink alcohol and does not use drugs.  Allergies:  Allergies  Allergen Reactions   Motrin  [Ibuprofen ] Other (See Comments)    Per provider. Reasons (unknown) to patient.   Cephalosporins Nausea  And Vomiting   Doxycycline Nausea And Vomiting   Cefdinir  Rash   Sulfa Antibiotics Rash    Prior to Admission medications   Medication Sig Start Date End Date Taking? Authorizing Provider  acetaminophen  (TYLENOL ) 325 MG tablet Take 650 mg by mouth every 6 (six) hours as needed.   Yes [provider]  brompheniramine-pseudoephedrine -DM 30-2-10 MG/5ML syrup Take 10 mLs by mouth every 6 (six) hours as needed (cough and congestion). 06/08/24  Yes Iola Lukes, FNP  drospirenone-ethinyl estradiol (YAZ) 3-0.02 MG tablet Take 1 tablet by mouth daily. 01/08/22  Yes [provider]  fluticasone  (FLONASE ) 50 MCG/ACT nasal spray Place 2 sprays into both nostrils daily. Shake well before use. Gently blow nose before spraying. Do not blow nose immediately after use. You should not taste the medication or feel it going down your throat; if you do, adjust your technique. 06/08/24  Yes Murrill, Samantha, FNP  meloxicam  (MOBIC ) 15 MG tablet One tab PO every 24 hours with a meal for 2 weeks, then once every 24 hours prn pain. 12/11/23  Yes Curtis Debby PARAS, MD  ondansetron  (ZOFRAN -ODT) 4 MG disintegrating tablet Take 1 tablet (4 mg total) by mouth every 8 (eight) hours as needed for nausea or vomiting. 02/25/22  Yes Crain, Whitney L, PA  ondansetron  (ZOFRAN -ODT) 4 MG disintegrating tablet Take 4 mg by mouth every 8 (eight) hours as needed. 11/17/23  Yes [provider]  triamcinolone  (KENALOG ) 0.1 % paste Use as directed 1 application  in the mouth or throat 2 (two) times daily. 02/25/22  Yes Crain, Whitney L, PA  Blood pressure 119/84, temperature (!) 80 F (26.7 C), temperature source Oral, height 5' 7 (1.702 m), weight 125 lb (56.7 kg), SpO2 99%. Exam: General: Communicates without difficulty, well nourished, no acute distress. Head: Normocephalic, no evidence injury, no tenderness, facial buttresses intact without stepoff. Face/sinus: No tenderness to palpation and percussion.  Facial movement is normal and symmetric. Eyes: PERRL, EOMI. No scleral icterus, conjunctivae clear. Neuro: CN II exam reveals vision grossly intact.  No nystagmus at any point of gaze. Ears: Auricles well formed without lesions.  Ear canals are intact without mass or lesion.  No erythema or edema is appreciated.  The TMs are intact without fluid. Nose: External evaluation reveals normal support and skin without lesions.  Dorsum is intact.  Anterior rhinoscopy reveals congested mucosa over anterior aspect of inferior turbinates and deviated septum.  No purulence noted. Oral:  Oral cavity and oropharynx are intact, symmetric, without erythema or edema.  Mucosa is moist without lesions. Neck: Full range of motion without pain.  There is no significant lymphadenopathy.  No masses palpable.  Thyroid bed within normal limits to palpation.  Parotid glands and submandibular glands equal bilaterally without mass.  Trachea is midline. Neuro:  CN 2-12 grossly intact.   Procedure:  Flexible Nasal Endoscopy: Description: Risks, benefits, and alternatives of flexible endoscopy were explained to the patient.  Specific mention was made of the risk of throat numbness with difficulty swallowing, possible bleeding from the nose and mouth, and pain from the procedure.  The patient gave oral consent to proceed.  The flexible scope was inserted into the right nasal cavity.  Endoscopy of the interior nasal cavity, superior, inferior, and middle meatus was performed. The sphenoid-ethmoid recess was examined. Edematous mucosa was noted.  No polyp, mass, or lesion was appreciated. Nasal septal deviation noted. Olfactory cleft was clear.  Nasopharynx was clear.  Turbinates were hypertrophied but without mass.  The procedure was repeated on the contralateral side with similar findings.  The patient tolerated the procedure well.   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 No polyps, tumors, or adenoids were identified during nasal endoscopy.  -The physical exam and nasal endoscopy findings are extensively reviewed with the patient. - Prescribe Flonase  Sensimist, two sprays in each nostril once daily, to reduce turbinate swelling. - Instruct on proper nasal spray technique to prevent epistaxis: lean forward, aim spray towards the ear, and avoid the septum. - Emphasize the importance of daily use to achieve therapeutic levels and improve nasal breathing. - Send prescription for Flonase  Sensimist to [facility]. - Schedule follow-up in two months to assess improvement.   Nykira Reddix W Whitfield Dulay 07/13/2024, 1:51 PM

## 2024-07-15 ENCOUNTER — Other Ambulatory Visit (INDEPENDENT_AMBULATORY_CARE_PROVIDER_SITE_OTHER): Payer: Self-pay | Admitting: Otolaryngology

## 2024-07-15 DIAGNOSIS — J342 Deviated nasal septum: Secondary | ICD-10-CM

## 2024-07-15 DIAGNOSIS — J343 Hypertrophy of nasal turbinates: Secondary | ICD-10-CM

## 2024-07-17 ENCOUNTER — Ambulatory Visit (INDEPENDENT_AMBULATORY_CARE_PROVIDER_SITE_OTHER): Admitting: Otolaryngology

## 2024-07-17 ENCOUNTER — Encounter (INDEPENDENT_AMBULATORY_CARE_PROVIDER_SITE_OTHER): Payer: Self-pay | Admitting: Otolaryngology

## 2024-07-17 VITALS — BP 119/82 | HR 87 | Ht 67.0 in | Wt 124.0 lb

## 2024-07-17 DIAGNOSIS — J310 Chronic rhinitis: Secondary | ICD-10-CM

## 2024-07-17 DIAGNOSIS — J342 Deviated nasal septum: Secondary | ICD-10-CM | POA: Diagnosis not present

## 2024-07-17 DIAGNOSIS — J3489 Other specified disorders of nose and nasal sinuses: Secondary | ICD-10-CM | POA: Diagnosis not present

## 2024-07-17 DIAGNOSIS — J343 Hypertrophy of nasal turbinates: Secondary | ICD-10-CM | POA: Diagnosis not present

## 2024-07-17 NOTE — Progress Notes (Addendum)
 Patient ID: Rachel Hodges, female   DOB: [birth year], 18 y.o.   MRN: [id number]  Follow up: Nasal obstruction  History of Present Illness Rachel Hodges is a 18 year old female who returns today complaining of persistent nasal obstruction.  She has been experiencing persistent nasal congestion and has been using steroid nasal sprays. She is concerned about the long-term necessity of this treatment and is inquiring about its success rate.  The nasal congestion is affecting her sleep, as she is unable to sleep through the night. This has been a persistent issue, and she is considering surgical intervention to alleviate the symptoms.  She has a history of severe nasal septal deviation and bilateral inferior turbinate hypertrophy, which is contributing to her nasal obstruction. She is interested in surgical procedures to correct these issues.  She works as a print production planner and notes that she can take a few days off work if necessary for recovery from surgery.  Exam: General: Communicates without difficulty, well nourished, no acute distress. Head: Normocephalic, no evidence injury, no tenderness, facial buttresses intact without stepoff. Face/sinus: No tenderness to palpation and percussion. Facial movement is normal and symmetric. Eyes: PERRL, EOMI. No scleral icterus, conjunctivae clear. Neuro: CN II exam reveals vision grossly intact.  No nystagmus at any point of gaze. Ears: Auricles well formed without lesions.  Ear canals are intact without mass or lesion.  No erythema or edema is appreciated.  The TMs are intact without fluid. Nose: External evaluation reveals normal support and skin without lesions.  Dorsum is intact.  Anterior rhinoscopy reveals congested mucosa over anterior aspect of inferior turbinates and deviated septum.  No purulence noted. Oral:  Oral cavity and oropharynx are intact, symmetric, without erythema or edema.  Mucosa is moist without lesions. Neck: Full range of motion without  pain.  There is no significant lymphadenopathy.  No masses palpable.  Thyroid bed within normal limits to palpation.  Parotid glands and submandibular glands equal bilaterally without mass.  Trachea is midline. Neuro:  CN 2-12 grossly intact.    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 Medical management with steroid nasal spray has been used, but symptoms persist, particularly affecting sleep. Surgical intervention is considered due to the mechanical nature of the obstruction and the potential for a more definitive solution. Surgery involves septoplasty and bilateral inferior turbinate reduction. - The risks, benefits, alternatives, and details of the procedures are extensively discussed.  Questions are invited and answered. - The patient would like to proceed with the procedures.  We will schedule the procedures in accordance with the patient's schedule.

## 2024-08-07 ENCOUNTER — Encounter (INDEPENDENT_AMBULATORY_CARE_PROVIDER_SITE_OTHER): Admitting: Otolaryngology

## 2024-08-11 DIAGNOSIS — J3489 Other specified disorders of nose and nasal sinuses: Secondary | ICD-10-CM | POA: Diagnosis not present

## 2024-08-11 DIAGNOSIS — J343 Hypertrophy of nasal turbinates: Secondary | ICD-10-CM | POA: Diagnosis not present

## 2024-08-11 DIAGNOSIS — J342 Deviated nasal septum: Secondary | ICD-10-CM | POA: Diagnosis not present

## 2024-08-12 ENCOUNTER — Other Ambulatory Visit (INDEPENDENT_AMBULATORY_CARE_PROVIDER_SITE_OTHER): Payer: Self-pay | Admitting: Otolaryngology

## 2024-08-12 ENCOUNTER — Telehealth (INDEPENDENT_AMBULATORY_CARE_PROVIDER_SITE_OTHER): Payer: Self-pay | Admitting: Otolaryngology

## 2024-08-12 MED ORDER — OXYCODONE-ACETAMINOPHEN 5-325 MG PO TABS: 1.0000 | ORAL_TABLET | ORAL | 0 refills | Status: AC | PRN

## 2024-08-12 NOTE — Telephone Encounter (Signed)
 Left patient a voice mail. [facility] medication was [facility] in to [facility] on [address]

## 2024-08-12 NOTE — Telephone Encounter (Signed)
 The patient is calling in requesting pain medication sent to her [facility] today- [facility]. Tylenol  is not helping and she is in pain.

## 2024-08-14 ENCOUNTER — Ambulatory Visit (INDEPENDENT_AMBULATORY_CARE_PROVIDER_SITE_OTHER): Admitting: Otolaryngology

## 2024-08-14 VITALS — BP 112/79 | HR 104

## 2024-08-14 DIAGNOSIS — J310 Chronic rhinitis: Secondary | ICD-10-CM

## 2024-08-14 DIAGNOSIS — J342 Deviated nasal septum: Secondary | ICD-10-CM

## 2024-08-14 DIAGNOSIS — Z09 Encounter for follow-up examination after completed treatment for conditions other than malignant neoplasm: Secondary | ICD-10-CM

## 2024-08-14 NOTE — Progress Notes (Signed)
 Doyle splints removed. Septum and turbinates are healing well.   Both [place] debrided.  Nasal saline irrigation.  Recheck in 3 weeks.

## 2024-08-25 ENCOUNTER — Encounter (INDEPENDENT_AMBULATORY_CARE_PROVIDER_SITE_OTHER): Payer: Self-pay | Admitting: Otolaryngology

## 2024-08-25 ENCOUNTER — Ambulatory Visit (INDEPENDENT_AMBULATORY_CARE_PROVIDER_SITE_OTHER): Admitting: Otolaryngology

## 2024-08-25 VITALS — HR 98 | Temp 97.6°F | Ht 67.0 in | Wt 134.0 lb

## 2024-08-25 DIAGNOSIS — J310 Chronic rhinitis: Secondary | ICD-10-CM

## 2024-08-25 NOTE — Progress Notes (Signed)
 Septum and turbinates are healing well.   Both [place] debrided.   Nasal saline irrigation as needed.   Recheck in 6 months.

## 2025-02-23 ENCOUNTER — Ambulatory Visit (INDEPENDENT_AMBULATORY_CARE_PROVIDER_SITE_OTHER): Admitting: Otolaryngology
